# Patient Record
Sex: Female | Born: 1975 | Race: Black or African American | Smoking: Never smoker
Health system: Southern US, Community
[De-identification: ages and names within clinical notes are randomized; demographics above are authoritative.]

## PROBLEM LIST (undated history)

## (undated) DIAGNOSIS — I2699 Other pulmonary embolism without acute cor pulmonale: Secondary | ICD-10-CM

## (undated) HISTORY — PX: CHOLECYSTECTOMY: SHX55

---

## 2010-08-29 ENCOUNTER — Encounter: Payer: Self-pay | Admitting: Emergency Medicine

## 2010-08-29 ENCOUNTER — Ambulatory Visit (INDEPENDENT_AMBULATORY_CARE_PROVIDER_SITE_OTHER): Payer: BC Managed Care – PPO | Admitting: Emergency Medicine

## 2010-08-29 DIAGNOSIS — J069 Acute upper respiratory infection, unspecified: Secondary | ICD-10-CM

## 2010-09-06 NOTE — Assessment & Plan Note (Signed)
Summary: BODY ACHE,HEADACHE,CHEST HURTS,WEAK Room 4   Vital Signs:  Patient Profile:   35 Years Old Female CC:      Fever, body aches, tired, runny nose, headace, cough x 3days Height:     62 inches Weight:      168 pounds O2 Sat:      98 % O2 treatment:    Room Air Temp:     99.1 degrees F oral Pulse rate:   100 / minute Pulse rhythm:   regular Resp:     18 per minute BP sitting:   128 / 83  (left arm) Cuff size:   regular  Vitals Entered By: Emilio Math (August 29, 2010 8:52 AM)                  Current Allergies: ! ASA ! CODEINEHistory of Present Illness History from: patient Chief Complaint: Fever, body aches, tired, runny nose, headace, cough x 3days History of Present Illness: 35 Years Old Female complains of onset of cold symptoms for 5 days.  Elsy has been using a lot of different types of OTC meds which is helping a little bit. No sore throat + cough No pleuritic pain No wheezing + nasal congestion + post-nasal drainage + sinus pain/pressure No chest congestion No itchy/red eyes No earache No hemoptysis No SOB No chills/sweats + fever No nausea No vomiting No abdominal pain No diarrhea No skin rashes No fatigue No myalgias No headache   Current Meds MUCINEX 600 MG XR12H-TAB (GUAIFENESIN)  ZUTRIPRO 60-4-5 MG/5ML SOLN (PSEUDOEPH-CHLORPHEN-HYDROCOD) 5cc q6 hrs as needed cough AMOXICILLIN 875 MG TABS (AMOXICILLIN) 1 by mouth two times a day for 7 days  REVIEW OF SYSTEMS Constitutional Symptoms       Complains of fever and chills.     Denies night sweats, weight loss, weight gain, and fatigue.  Eyes       Denies change in vision, eye pain, eye discharge, glasses, contact lenses, and eye surgery. Ear/Nose/Throat/Mouth       Complains of frequent runny nose, sinus problems, and sore throat.      Denies hearing loss/aids, change in hearing, ear pain, ear discharge, dizziness, frequent nose bleeds, hoarseness, and tooth pain or bleeding.    Respiratory       Complains of dry cough.      Denies productive cough, wheezing, shortness of breath, asthma, bronchitis, and emphysema/COPD.  Cardiovascular       Denies murmurs, chest pain, and tires easily with exhertion.    Gastrointestinal       Denies stomach pain, nausea/vomiting, diarrhea, constipation, blood in bowel movements, and indigestion. Genitourniary       Denies painful urination, kidney stones, and loss of urinary control. Neurological       Complains of headaches.      Denies paralysis, seizures, and fainting/blackouts. Musculoskeletal       Complains of muscle pain and joint pain.      Denies joint stiffness, decreased range of motion, redness, swelling, muscle weakness, and gout.  Skin       Denies bruising, unusual mles/lumps or sores, and hair/skin or nail changes.  Psych       Denies mood changes, temper/anger issues, anxiety/stress, speech problems, depression, and sleep problems.  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, drilling Physical Exam General appearance: well developed, well nourished, no acute  distress Ears: normal, no lesions or deformities Nasal: mucosa pink, nonedematous, no septal deviation, turbinates normal Oral/Pharynx: tongue normal, posterior pharynx without erythema or exudate Chest/Lungs: no rales, wheezes, or rhonchi bilateral, breath sounds equal without effort Heart: regular rate and  rhythm, no murmur MSE: oriented to time, place, and person Assessment New Problems: UPPER RESPIRATORY INFECTION, ACUTE (ICD-465.9)   Plan New Medications/Changes: AMOXICILLIN 875 MG TABS (AMOXICILLIN) 1 by mouth two times a day for 7 days  #14 x 0, 08/29/2010, Hoyt Koch MD ZUTRIPRO 60-4-5 MG/5ML SOLN (PSEUDOEPH-CHLORPHEN-HYDROCOD) 5cc q6 hrs as needed cough  #6oz x 0,  08/29/2010, Hoyt Koch MD  New Orders: New Patient Level III [phone] Pulse Oximetry (single measurment) [94760] Planning Comments:   1)  Take the prescribed antibiotic as instructed. 2)  Use nasal saline solution (over the counter) at least 3 times a day. 3)  Use over the counter decongestants like Zyrtec-D every 12 hours as needed to help with congestion. 4)  Can take tylenol every 6 hours or motrin every 8 hours for pain or fever. 5)  Follow up with your primary doctor  if no improvement in 5-7 days, sooner if increasing pain, fever, or new symptoms.    The patient and/or caregiver has been counseled thoroughly with regard to medications prescribed including dosage, schedule, interactions, rationale for use, and possible side effects and they verbalize understanding.  Diagnoses and expected course of recovery discussed and will return if not improved as expected or if the condition worsens. Patient and/or caregiver verbalized understanding.  Prescriptions: AMOXICILLIN 875 MG TABS (AMOXICILLIN) 1 by mouth two times a day for 7 days  #14 x 0   Entered and Authorized by:   Hoyt Koch MD   Signed by:   Hoyt Koch MD on 08/29/2010   Method used:   Print then Give to Patient   RxID:   [id number] ZUTRIPRO 60-4-5 MG/5ML SOLN (PSEUDOEPH-CHLORPHEN-HYDROCOD) 5cc q6 hrs as needed cough  #6oz x 0   Entered and Authorized by:   Hoyt Koch MD   Signed by:   Hoyt Koch MD on 08/29/2010   Method used:   Print then Give to Patient   RxID:   [id number]   Orders Added: 1)  New Patient Level III [29528] 2)  Pulse Oximetry (single measurment) [41324]

## 2011-05-03 ENCOUNTER — Encounter: Payer: Self-pay | Admitting: *Deleted

## 2011-05-03 ENCOUNTER — Emergency Department
Admit: 2011-05-03 | Discharge: 2011-05-03 | Disposition: A | Discharge status: Home / Self Care | Payer: BC Managed Care – PPO

## 2011-05-03 ENCOUNTER — Emergency Department
Admission: EM | Admit: 2011-05-03 | Discharge: 2011-05-03 | Disposition: A | Discharge status: Home / Self Care | Payer: BC Managed Care – PPO | Source: Home / Self Care | Attending: Emergency Medicine | Admitting: Emergency Medicine

## 2011-05-03 DIAGNOSIS — M7750 Other enthesopathy of unspecified foot: Secondary | ICD-10-CM

## 2011-05-03 DIAGNOSIS — M659 Synovitis and tenosynovitis, unspecified: Secondary | ICD-10-CM

## 2011-05-03 DIAGNOSIS — M25579 Pain in unspecified ankle and joints of unspecified foot: Secondary | ICD-10-CM

## 2011-05-03 DIAGNOSIS — M7740 Metatarsalgia, unspecified foot: Secondary | ICD-10-CM

## 2011-05-03 DIAGNOSIS — M7500 Adhesive capsulitis of unspecified shoulder: Secondary | ICD-10-CM

## 2011-05-03 NOTE — ED Provider Notes (Signed)
History     CSN: [id number] Arrival date & time: 05/03/2011 12:06 PM   First MD Initiated Contact with Patient 05/03/11 1209      Chief Complaint  Patient presents with  . Foot Pain    (Consider location/radiation/quality/duration/timing/severity/associated sxs/prior treatment) HPI Left foot pain for 2 weeks.  No known trauma, but did start the Insanity Workout 2 weeks ago. She also runs approx 10 miles per week.  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  No previous foot fractures/injuries.  The pain is sore, and located near the 1st and 2nd toes.  She reports pain, swelling, and has to walk on the outside of her foot.  History reviewed. No pertinent past medical history.  Past Surgical History  Procedure Date  . Cholecystectomy     Family History  Problem Relation Age of Onset  . Diabetes Mother     History  Substance Use Topics  . Smoking status: Never Smoker   . Smokeless tobacco: Not on file  . Alcohol Use: No    OB History    Grav Para Term Preterm Abortions TAB SAB Ect Mult Living                  Review of Systems  Allergies  Aspirin and Codeine  Home Medications  No current outpatient prescriptions on file.  BP 137/84  Pulse 79  Temp(Src) 98.2 F (36.8 C) (Oral)  Resp 16  Ht 5\' 2"  (1.575 m)  Wt 178 lb (80.74 kg)  BMI 32.56 kg/m2  SpO2 98%  Physical Exam  Nursing note and vitals reviewed. Constitutional: She is oriented to person, place, and time. She appears well-developed and well-nourished.  HENT:  Head: Normocephalic and atraumatic.  Neck: Neck supple.  Pulmonary/Chest: No respiratory distress.  Neurological: She is alert and oriented to person, place, and time.  Skin: Skin is warm and dry.  Psychiatric: She has a normal mood and affect. Her speech is normal.  L ankle/foot: FROM, +TTP 1st and 2nd metatarsal and with metatarsal squeezing, resisted great toe extension and foot dorsiflexion causes discomfort.   No  TTP medial/lateral malleolus, navicular, base of 5th, calcaneus, Achilles, proximal fibula.  No swelling.  No ecchymoses.  Distal neurovascular status is intact.  ED Course  Procedures (including critical care time)  Labs Reviewed - No data to display No results found.   No diagnosis found.    MDM      Lily Kocher, MD 05/03/11 [phone]

## 2011-05-03 NOTE — ED Notes (Signed)
Left foot pain x 2 weeks

## 2011-06-03 ENCOUNTER — Emergency Department (INDEPENDENT_AMBULATORY_CARE_PROVIDER_SITE_OTHER)
Admission: EM | Admit: 2011-06-03 | Discharge: 2011-06-03 | Disposition: A | Discharge status: Home / Self Care | Payer: BC Managed Care – PPO | Source: Home / Self Care | Attending: Family Medicine | Admitting: Family Medicine

## 2011-06-03 ENCOUNTER — Encounter: Payer: Self-pay | Admitting: *Deleted

## 2011-06-03 DIAGNOSIS — R6889 Other general symptoms and signs: Secondary | ICD-10-CM

## 2011-06-03 DIAGNOSIS — M940 Chondrocostal junction syndrome [Tietze]: Secondary | ICD-10-CM

## 2011-06-03 MED ORDER — CEFDINIR 300 MG PO CAPS: 300.0000 mg | ORAL_CAPSULE | Freq: Two times a day (BID) | ORAL | Status: AC

## 2011-06-03 MED ORDER — BENZONATATE 200 MG PO CAPS: 200.0000 mg | ORAL_CAPSULE | Freq: Every day | ORAL | Status: AC

## 2011-06-03 NOTE — ED Notes (Addendum)
Patient c/o fever, cough, chest soreness with cough, body aches and HA x 3 days. Patient has not received a flu shot.

## 2011-06-05 NOTE — ED Provider Notes (Signed)
History     CSN: [id number] Arrival date & time: 06/03/2011  6:21 PM   First MD Initiated Contact with Patient 06/03/11 1917      Chief Complaint  Patient presents with  . Fever  . Headache     HPI Comments: HPI : Flu symptoms for 3 days. Fever to 101.4 with chills, sweats, myalgias, fatigue, headache. Symptoms are progressively worsening, despite trying OTC fever reducing medicine and rest and fluids. Has decreased appetite, but tolerating some liquids by mouth.   Review of Systems: Positive for fatigue, mild nasal congestion, mild swollen anterior neck glands, non-productive cough, myalgias, and tightness in her anterior chest. Negative for acute vision changes, sore throat, stiff neck, focal weakness, syncope, seizures, respiratory distress, wheezing, vomiting, diarrhea, GU symptoms.   Patient is a 35 y.o. female presenting with URI. The history is provided by the patient.  URI    History reviewed. No pertinent past medical history.  Past Surgical History  Procedure Date  . Cholecystectomy     Family History  Problem Relation Age of Onset  . Diabetes Mother     History  Substance Use Topics  . Smoking status: Never Smoker   . Smokeless tobacco: Not on file  . Alcohol Use: No    OB History    Grav Para Term Preterm Abortions TAB SAB Ect Mult Living                  Review of Systems  Allergies  Aspirin and Codeine  Home Medications   Current Outpatient Rx  Name Route Sig Dispense Refill  . BENZONATATE 200 MG PO CAPS Oral Take 1 capsule (200 mg total) by mouth at bedtime. Take as needed for cough 12 capsule 0  . CEFDINIR 300 MG PO CAPS Oral Take 1 capsule (300 mg total) by mouth 2 (two) times daily. 20 capsule 0    BP 137/87  Pulse 119  Temp(Src) 103 F (39.4 C) (Oral)  Resp 16  Ht 5\' 2"  (1.575 m)  Wt 176 lb (79.833 kg)  BMI 32.19 kg/m2  SpO2 98%  Physical Exam Nursing notes and Vital Signs reviewed. Appearance:  Patient appears healthy,  stated age, and in no acute distress.  Patient is obese (BMI 32.3) Eyes:  Pupils are equal, round, and reactive to light and accomodation.  Extraocular movement is intact.  Conjunctivae are not inflamed  Ears:  Canals normal.  Tympanic membranes normal.  Nose:  Mildly congested turbinates.  No sinus tenderness.   Pharynx:  Normal  Neck:  Supple.  Slightly tender shotty posterior nodes are palpated bilaterally  Lungs:  Clear to auscultation.  Breath sounds are equal.  Chest:  Distinct tenderness to palpation over the mid-sternum.  Heart:  Regular rate and rhythm without murmurs, rubs, or gallops.  Abdomen:  Nontender without masses or hepatosplenomegaly.  Bowel sounds are present.  No CVA or flank tenderness.  Extremities:  No edema.  Skin:  No rash present.   ED Course  Procedures  none      1. Influenza-like illness   2. Costochondritis, acute       MDM  Patient has had flu symptoms for longer than 48 hours.  Will begin Omnicef.  Rx for cough suppressant at bedtime. Take Mucinex (guaifenesin) twice daily for congestion.  Increase fluid intake, rest. Stop all antihistamines for now, and other non-prescription cough/cold preparations. May take Aleve, 2 tabs every 12 hours with food for chest/sternum discomfort. Recommend flu shot when well.  Follow-up with family doctor if not improving about 5 days.      Donna Christen, MD 06/05/11 2209

## 2012-10-19 IMAGING — CR DG FOOT COMPLETE 3+V*L*
3 series · 3 of 3 positions shown · non-contrast
Comparison: None

CLINICAL DATA: Pain first and second metatarsals.

LEFT FOOT - COMPLETE 3+ VIEW

[view not recorded (1 of 3)]
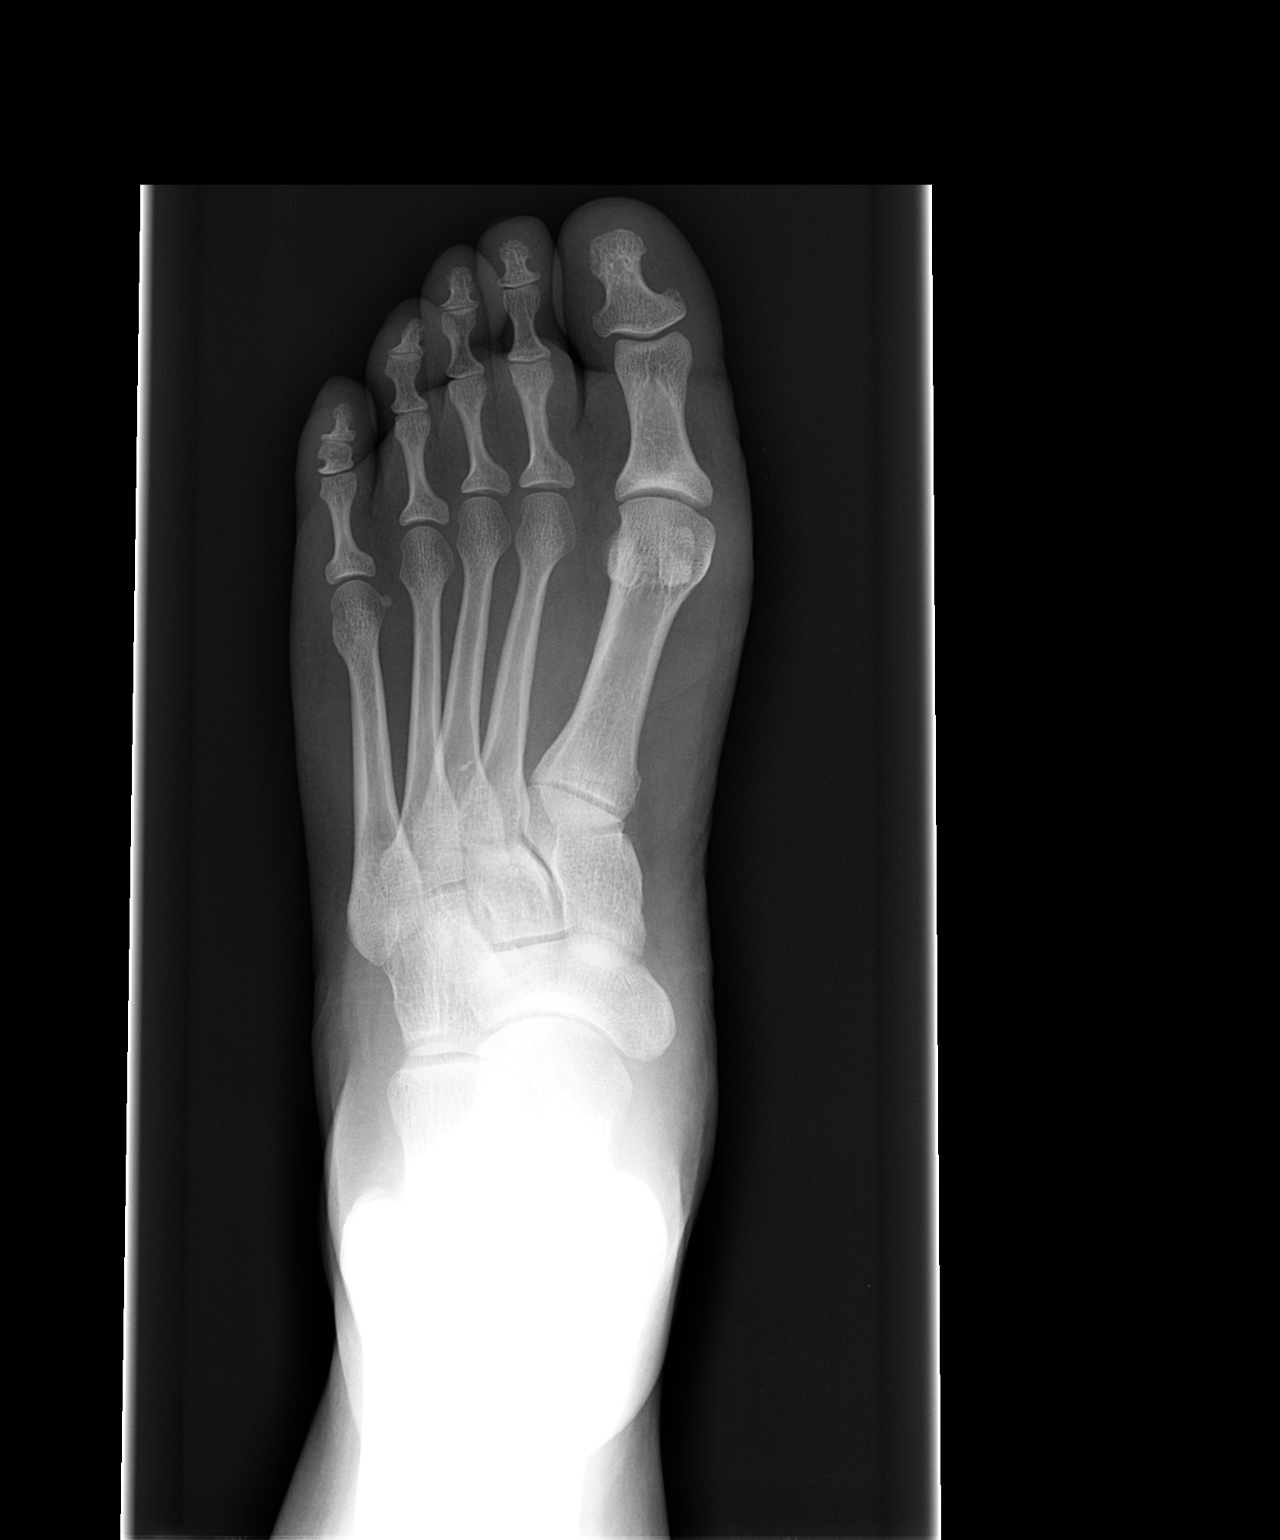

[view not recorded (2 of 3)]
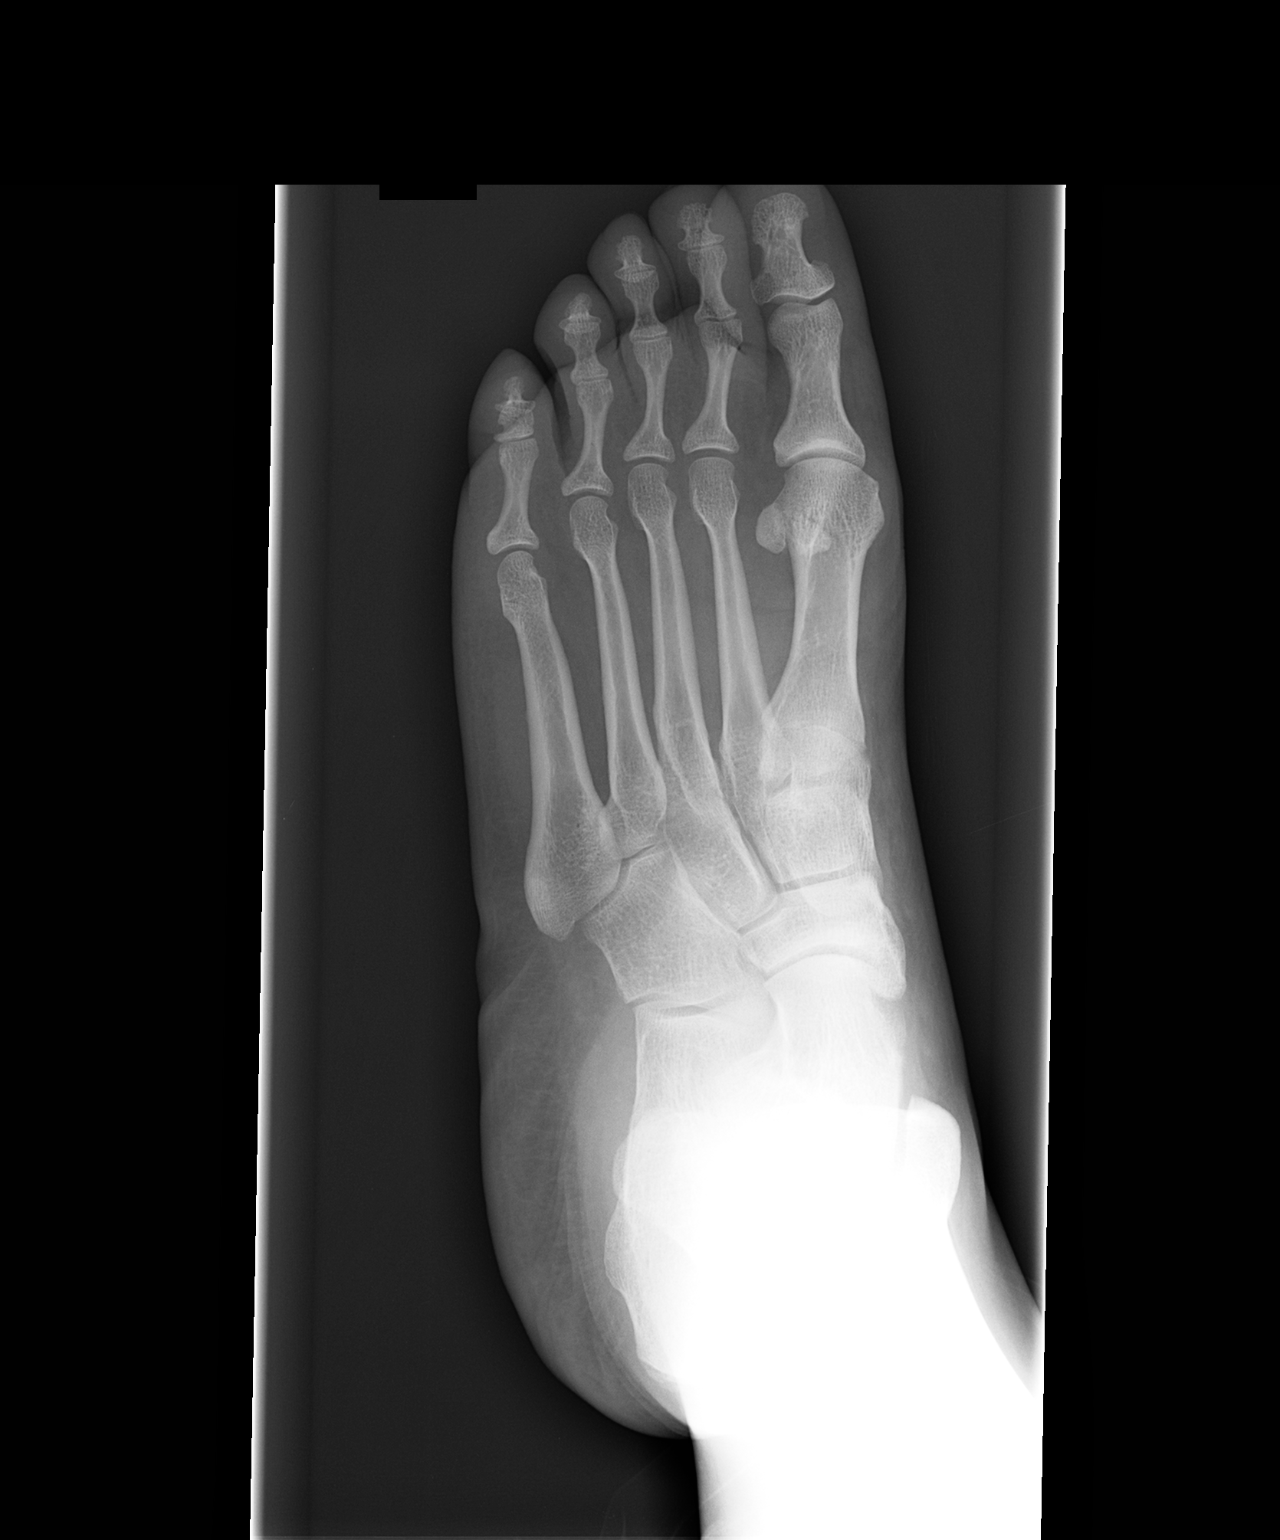

[view not recorded (3 of 3)]
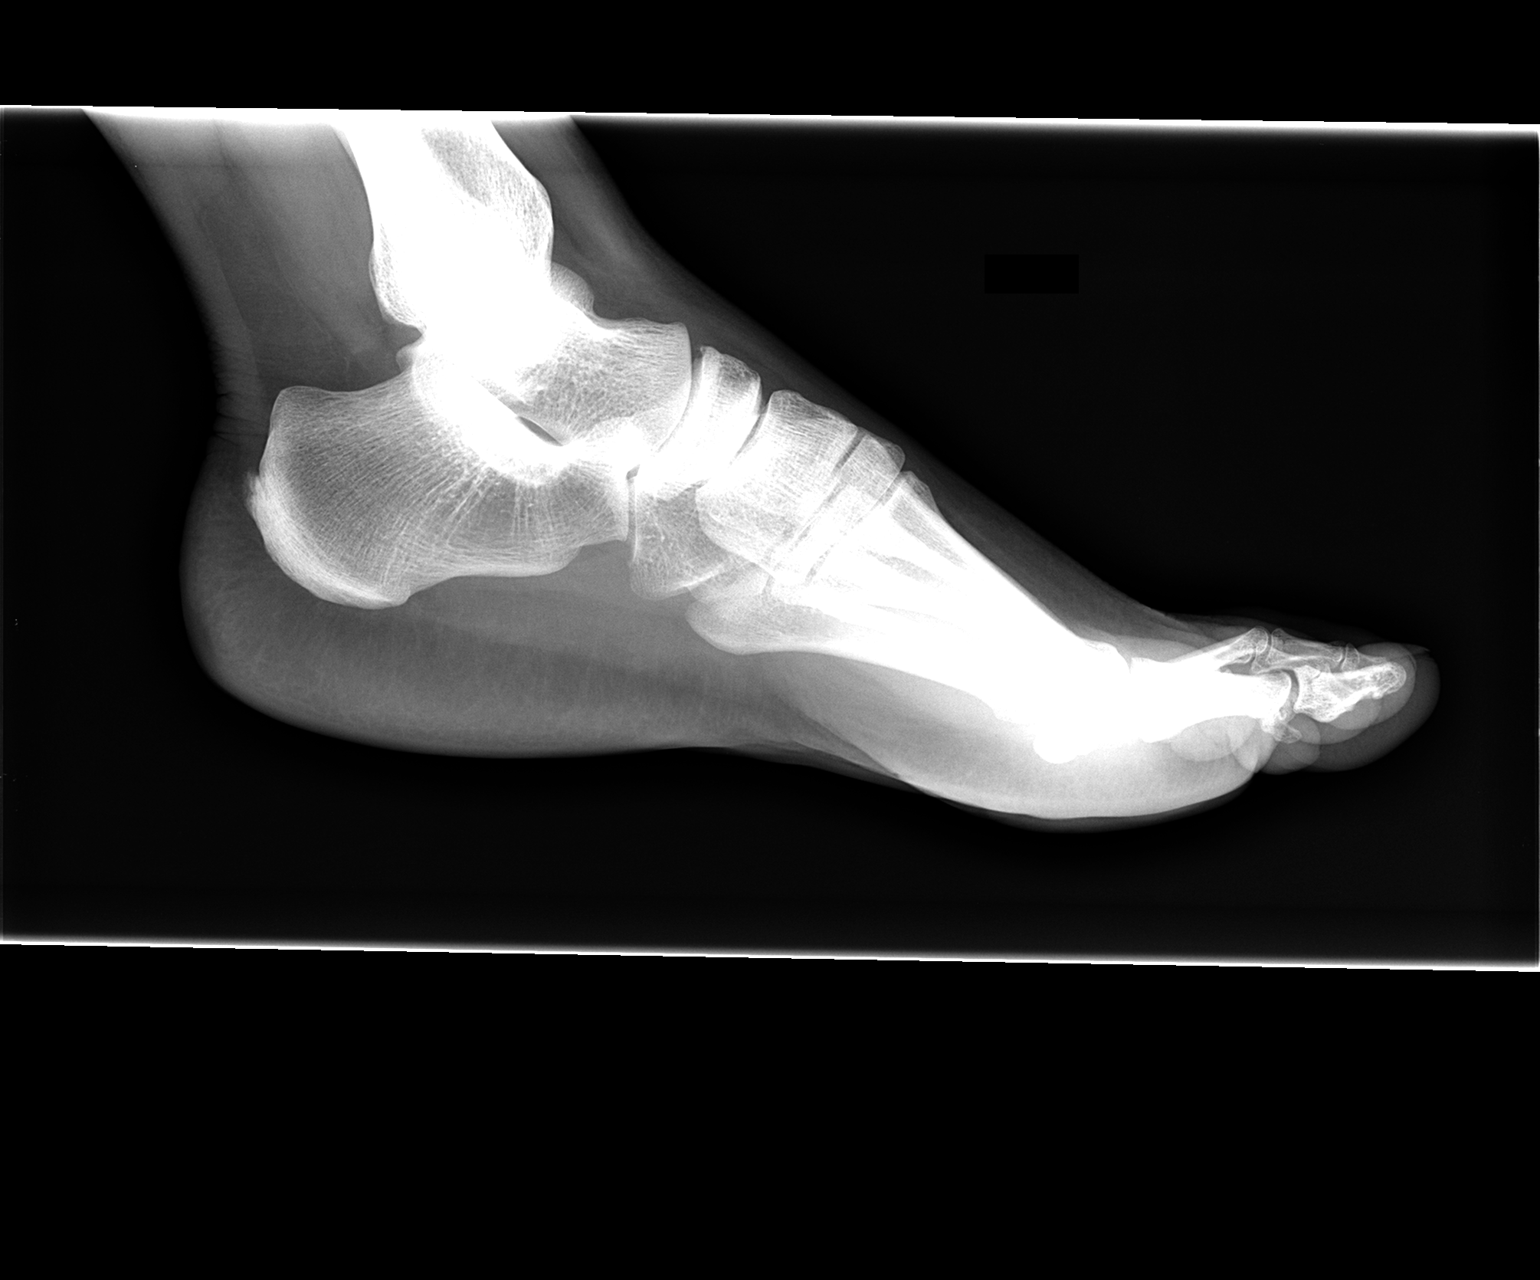

[3 of 3 positions shown; findings below may reference images not displayed]

FINDINGS: No acute bony abnormality.  Specifically, no fracture,
subluxation, or dislocation.  Soft tissues are intact.
IMPRESSION: Normal study.

## 2014-06-21 ENCOUNTER — Emergency Department
Admission: EM | Admit: 2014-06-21 | Discharge: 2014-06-21 | Disposition: A | Discharge status: Home / Self Care | Payer: 59 | Source: Home / Self Care | Attending: Family Medicine | Admitting: Family Medicine

## 2014-06-21 ENCOUNTER — Encounter: Payer: Self-pay | Admitting: *Deleted

## 2014-06-21 DIAGNOSIS — R69 Illness, unspecified: Principal | ICD-10-CM

## 2014-06-21 DIAGNOSIS — J111 Influenza due to unidentified influenza virus with other respiratory manifestations: Secondary | ICD-10-CM

## 2014-06-21 HISTORY — DX: Other pulmonary embolism without acute cor pulmonale: I26.99

## 2014-06-21 LAB — POCT INFLUENZA A/B
Influenza A, POC: NEGATIVE
Influenza B, POC: NEGATIVE

## 2014-06-21 MED ORDER — OSELTAMIVIR PHOSPHATE 75 MG PO CAPS: 75.0000 mg | ORAL_CAPSULE | Freq: Two times a day (BID) | ORAL | Status: AC

## 2014-06-21 MED ORDER — BENZONATATE 200 MG PO CAPS: 200.0000 mg | ORAL_CAPSULE | Freq: Every day | ORAL | Status: AC

## 2014-06-21 NOTE — ED Notes (Signed)
Pt c/o body aches, chills, fatigue, chest hurts and a little cough x 1 day. Denies fever. She took Nyquil at 11am.

## 2014-06-21 NOTE — Discharge Instructions (Signed)
Take plain Mucinex (1200 mg guaifenesin) twice daily for cough and congestion.  May add Sudafed for sinus congestion.   Increase fluid intake, rest. May use Afrin nasal spray (or generic oxymetazoline) twice daily for about 5 days.  Also recommend using saline nasal spray several times daily and saline nasal irrigation (AYR is a common brand) Try warm salt water gargles for sore throat.  Stop all antihistamines for now, and other non-prescription cough/cold preparations. May take Aleve, 2 tabs with food for every 12 hours for body aches, chest/sternum discomfort, etc.   Follow-up with family doctor if not improving about 7 to 10 days.

## 2014-06-21 NOTE — ED Provider Notes (Signed)
CSN: [id number]     Arrival date & time 06/21/14  1202 History   First MD Initiated Contact with Patient 06/21/14 1344     Chief Complaint  Patient presents with  . Generalized Body Aches  . Chills      HPI Comments: Yesterday patient developed flu-like symptoms yesterday with sudden onset fatigue, myalgias, cough, chills/sweats, and headache.  The history is provided by the patient.    Past Medical History  Diagnosis Date  . Pulmonary embolism    Past Surgical History  Procedure Laterality Date  . Cholecystectomy     Family History  Problem Relation Age of Onset  . Diabetes Mother    History  Substance Use Topics  . Smoking status: Never Smoker   . Smokeless tobacco: Not on file  . Alcohol Use: No   OB History    No data available     Review of Systems No sore throat + cough No pleuritic pain but has anterior chest tightness No wheezing + nasal congestion + post-nasal drainage No sinus pain/pressure No itchy/red eyes No earache No hemoptysis No SOB + fever, + chills No nausea No vomiting No abdominal pain No diarrhea No urinary symptoms No skin rash + fatigue + myalgias + headache Used OTC meds without relief  Allergies  Aspirin and Codeine  Home Medications   Prior to Admission medications   Medication Sig Start Date End Date Taking? Authorizing Provider  benzonatate (TESSALON) 200 MG capsule Take 1 capsule (200 mg total) by mouth at bedtime. Take as needed for cough 06/21/14   Lattie HawStephen A Coltan Spinello, MD  oseltamivir (TAMIFLU) 75 MG capsule Take 1 capsule (75 mg total) by mouth every 12 (twelve) hours. 06/21/14   Lattie HawStephen A Veva Grimley, MD   BP 117/80 mmHg  Pulse 71  Temp(Src) 98.7 F (37.1 C) (Oral)  Resp 18  Ht 5\' 2"  (1.575 m)  Wt 170 lb (77.111 kg)  BMI 31.09 kg/m2  SpO2 99%  LMP 05/21/2014 Physical Exam Nursing notes and Vital Signs reviewed. Appearance:  Patient appears stated age, and in no acute distress.  Patient is obese (BMI 31.1) Eyes:   Pupils are equal, round, and reactive to light and accomodation.  Extraocular movement is intact.  Conjunctivae are not inflamed  Ears:  Canals normal.  Tympanic membranes normal.  Nose:  Mildly congested turbinates.  No sinus tenderness.  Pharynx:  Normal Neck:  Supple.  Enlarged tender posterior nodes are palpated bilaterally  Lungs:  Clear to auscultation.  Breath sounds are equal.  Chest:  Distinct tenderness to palpation over the mid-sternum.  Heart:  Regular rate and rhythm without murmurs, rubs, or gallops.  Abdomen:  Nontender without masses or hepatosplenomegaly.  Bowel sounds are present.  No CVA or flank tenderness.  Extremities:  No edema.  No calf tenderness Skin:  No rash present.   ED Course  Procedures  None    Labs Reviewed  POCT INFLUENZA A/B negative         MDM   1. Influenza-like illness    Begin Tamiflu.  Prescription written for Benzonatate [facility](Tessalon) to take at bedtime for night-time cough.  Take plain Mucinex (1200 mg guaifenesin) twice daily for cough and congestion.  Increase fluid intake, rest. Stop all antihistamines for now, and other non-prescription cough/cold preparations.  Follow-up with family doctor if not improving about10 days.  Lattie HawStephen A Mouhamadou Gittleman, MD 06/22/14 [phone]

## 2014-06-24 ENCOUNTER — Telehealth: Payer: Self-pay | Admitting: *Deleted
